# Patient Record
Sex: Male | Born: 1970 | Race: White | Hispanic: No | Marital: Married | Smoking: Never smoker
Health system: Southern US, Community
[De-identification: ages and names within clinical notes are randomized; demographics above are authoritative.]

---

## 2012-12-09 ENCOUNTER — Ambulatory Visit (INDEPENDENT_AMBULATORY_CARE_PROVIDER_SITE_OTHER): Payer: No Typology Code available for payment source | Admitting: Sports Medicine

## 2012-12-09 ENCOUNTER — Encounter: Payer: Self-pay | Admitting: Sports Medicine

## 2012-12-09 ENCOUNTER — Ambulatory Visit
Admission: RE | Admit: 2012-12-09 | Discharge: 2012-12-09 | Disposition: A | Discharge status: Home / Self Care | Payer: No Typology Code available for payment source | Source: Ambulatory Visit | Attending: Emergency Medicine | Admitting: Emergency Medicine

## 2012-12-09 VITALS — BP 123/82 | HR 64 | Ht 73.0 in | Wt 190.0 lb

## 2012-12-09 DIAGNOSIS — M545 Low back pain: Secondary | ICD-10-CM

## 2012-12-09 DIAGNOSIS — M533 Sacrococcygeal disorders, not elsewhere classified: Secondary | ICD-10-CM

## 2012-12-09 MED ORDER — CYCLOBENZAPRINE HCL 10 MG PO TABS: 10.0000 mg | ORAL_TABLET | Freq: Every evening | ORAL | Status: DC | PRN

## 2012-12-09 NOTE — Patient Instructions (Signed)
Take Naprosyn two tablets twice a day of the over the counter dose.  An Xray was ordered for you today.  In addition a prescription for Flexeril is being ordered for you to take at night.  We will evaluate you xrays and discuss further treatment.

## 2012-12-09 NOTE — Assessment & Plan Note (Signed)
Lumbosacral x-rays were ordered today. In addition he'll start a regimen of Naprosyn 500 mg twice a day and Flexeril at night. Will followup after the x-rays.

## 2012-12-09 NOTE — Progress Notes (Signed)
Patient ID: Basilio Meadow, male   DOB: May 29, 1970, 42 y.o.   MRN: [id number] 42 year old male presents with complaint of low back pain. Reports mild back pain the past 2-3 weeks significantly worse and on Sunday morning 5 days ago. Pain is worse when trying to get out of bed in the morning as well as when trying to sit down. Symptoms are improved with standing. Denies weakness or numbness. No radiation of pain down legs. No bowel or bladder incontinence.  He is a fairly active 42 year old male who lifts weights approximately 3 times a week. There's been no change in his weight routine prior to this. He also runs on occasion. He notes that running worsens his symptoms. He also performs karate on occasion this has not bothered his back prior to this week. Denies a significant injury prior to these episodes or any changes in his exercise routine prior to this episode.  Past medical history significant for melanoma which has been treated otherwise negative.  He is a nonsmoker  Family history significant for degenerative disc disease and spinal stenosis  Review of systems as per history of present illness otherwise negative.  Examination: BP 123/82  Pulse 64  Ht 6\' 1"  (1.854 m)  Wt 190 lb (86.183 kg)  BMI 25.07 kg/m2 This is a well-developed well-nourished 42 year old male awake alert and oriented in no acute distress, but obviously uncomfortable standing in exam room.  Examination of his back reveals no evidence of tenderness to palpation in his lumbar region. Pain is reproduced with forward flexion.  Extension to 15 does not worsened symptoms. Negative straight leg raise to 60 bilaterally 5/5 strength in his bilateral lower extremities to flexion extension of the knees as well as plantar or dorsiflexion of the feet. FABER and FADIR testing worsen symptoms in his back most notably on the left. Pulses intact in his bilateral lower extremities.  Gait is normal.

## 2012-12-17 ENCOUNTER — Telehealth: Payer: Self-pay | Admitting: *Deleted

## 2012-12-17 DIAGNOSIS — M545 Low back pain, unspecified: Secondary | ICD-10-CM

## 2012-12-17 NOTE — Telephone Encounter (Signed)
Pt still in significant pain.  Taking naprosyn and flexeril at bedtime.  X-rays reviewed by Dr. Lorri Frederick- he ordered MRI.  Scheduled for 12/24/12 @ 8:30 pm. [facility] [address].

## 2012-12-22 ENCOUNTER — Other Ambulatory Visit: Payer: Self-pay | Admitting: *Deleted

## 2012-12-22 MED ORDER — DIAZEPAM 5 MG PO TABS: ORAL_TABLET | ORAL | Status: DC

## 2012-12-24 ENCOUNTER — Inpatient Hospital Stay: Admission: RE | Admit: 2012-12-24 | Payer: No Typology Code available for payment source | Source: Ambulatory Visit

## 2013-01-01 ENCOUNTER — Other Ambulatory Visit: Payer: No Typology Code available for payment source

## 2015-08-08 IMAGING — CR DG LUMBAR SPINE COMPLETE 4+V
5 series · 5 of 5 positions shown · non-contrast
Comparison: None.

CLINICAL DATA: Low back pain for 5 days, no injury

EXAM:
LUMBAR SPINE - COMPLETE 4+ VIEW

[view not recorded (1 of 5)]
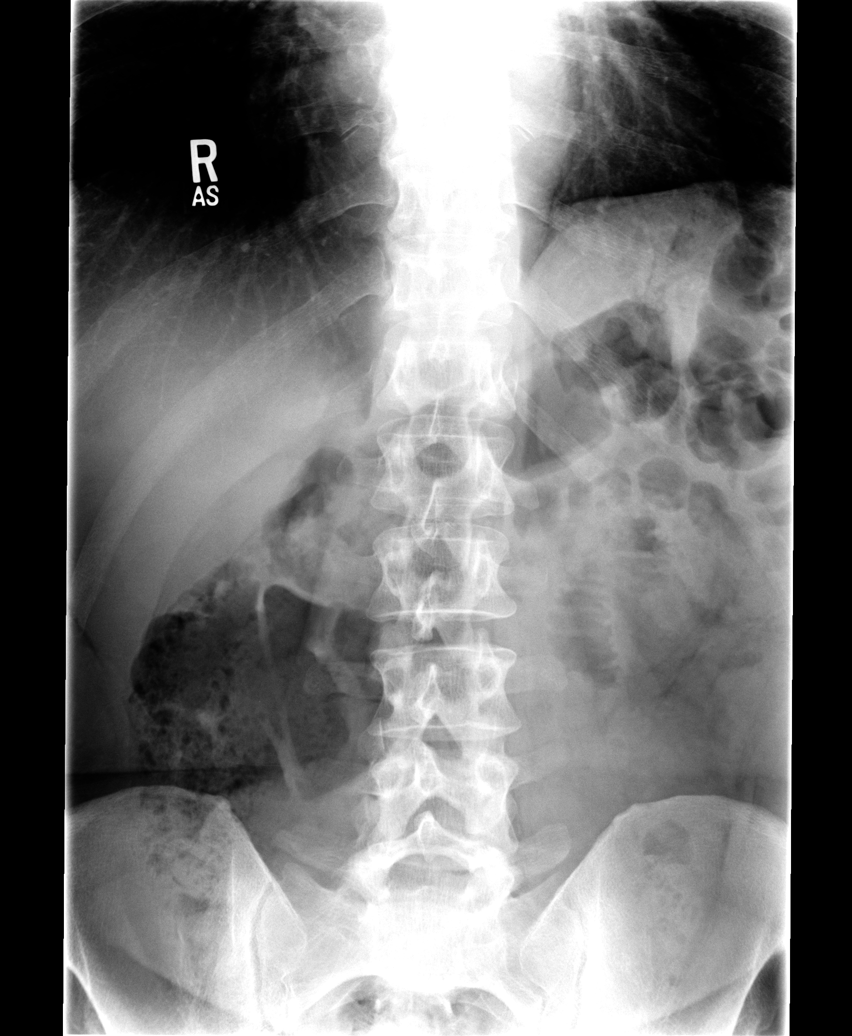

[view not recorded (2 of 5)]
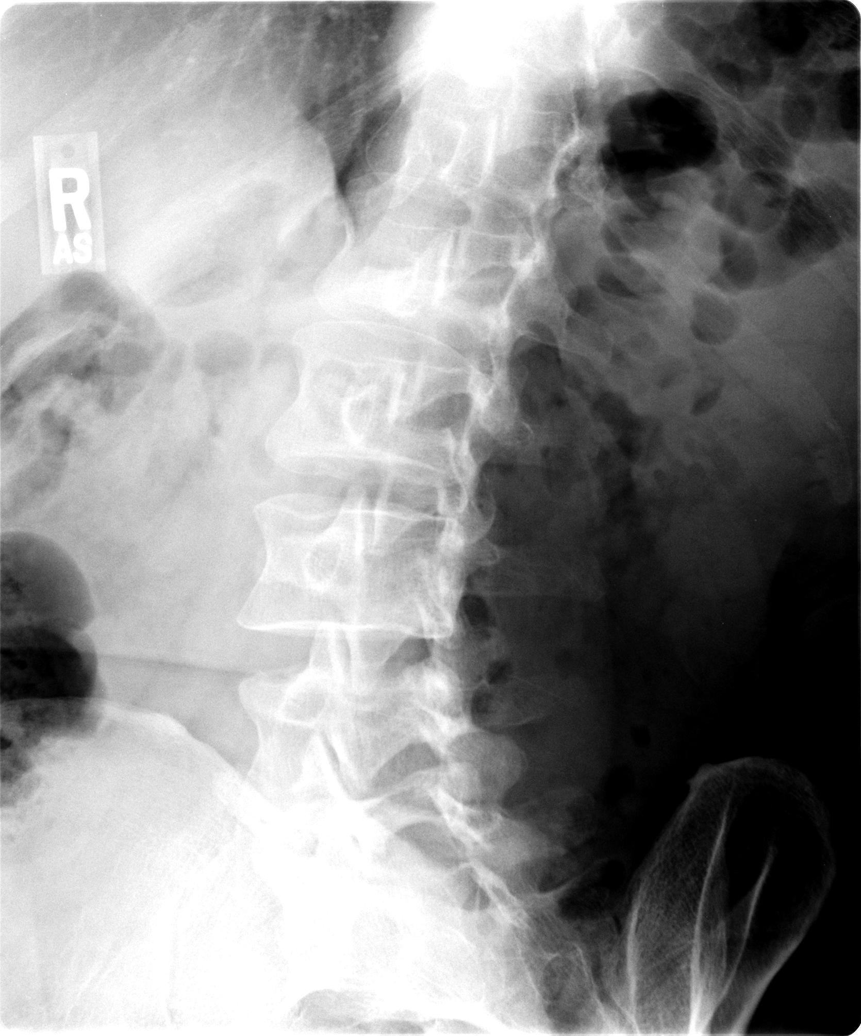

[view not recorded (3 of 5)]
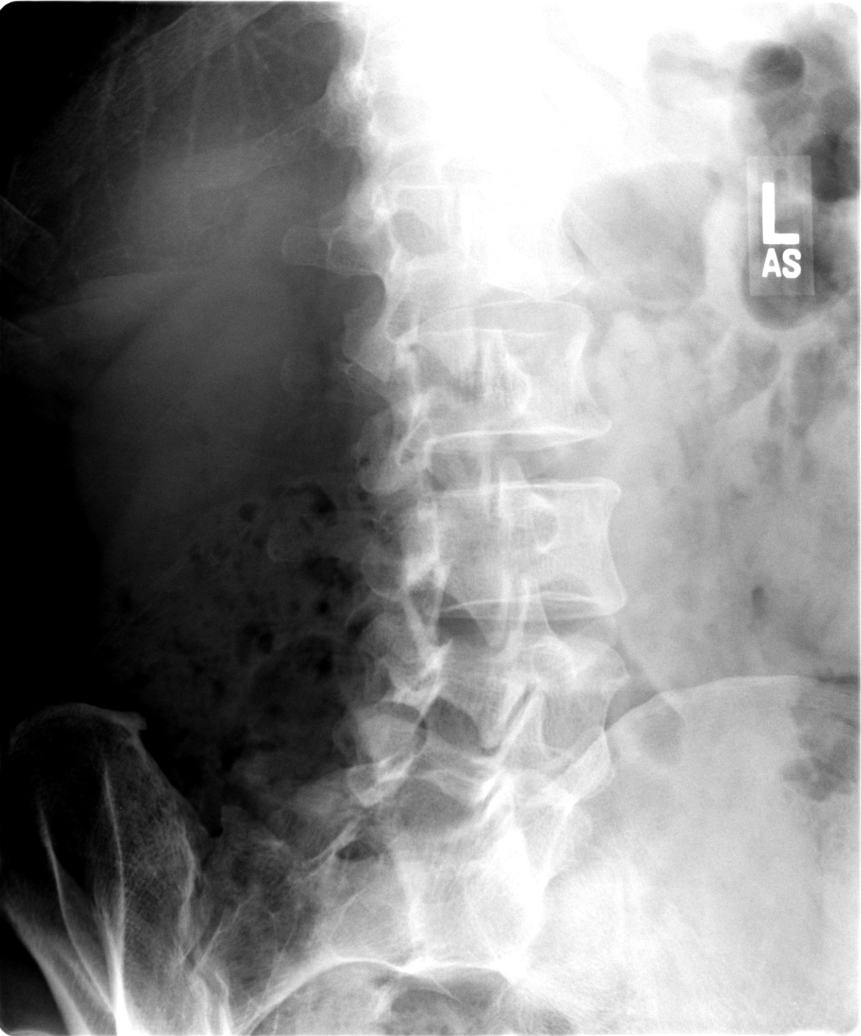

[view not recorded (4 of 5)]
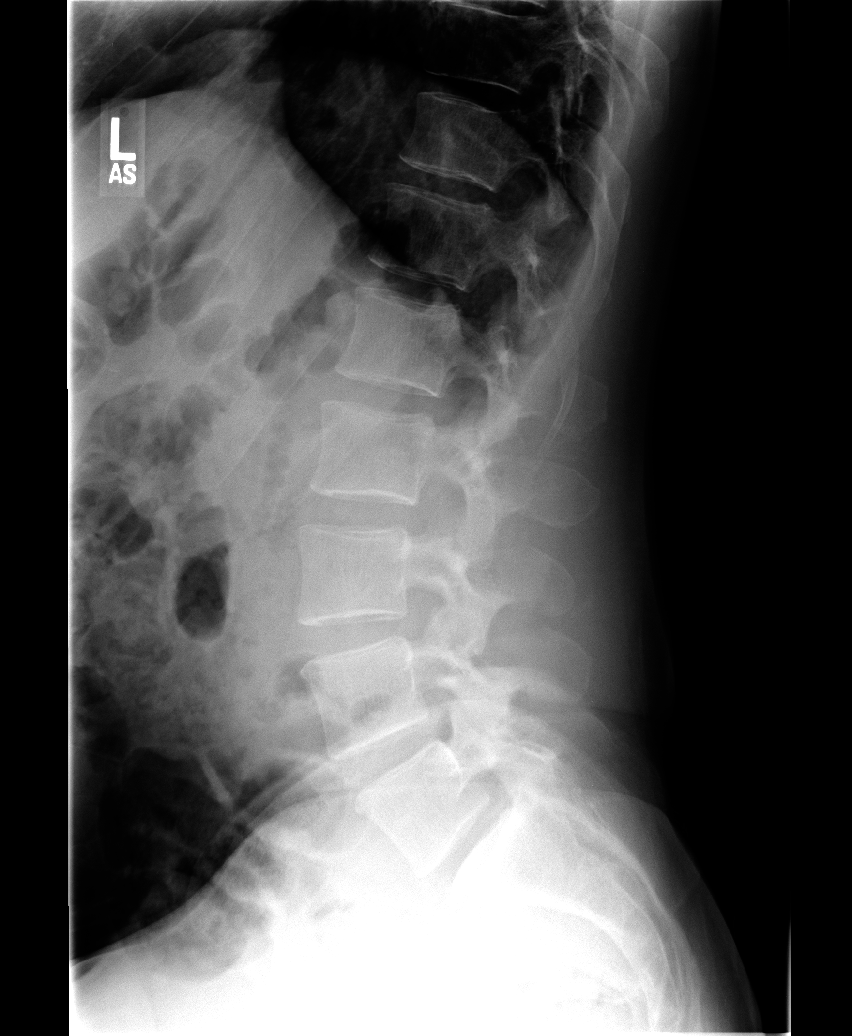

[view not recorded (5 of 5)]
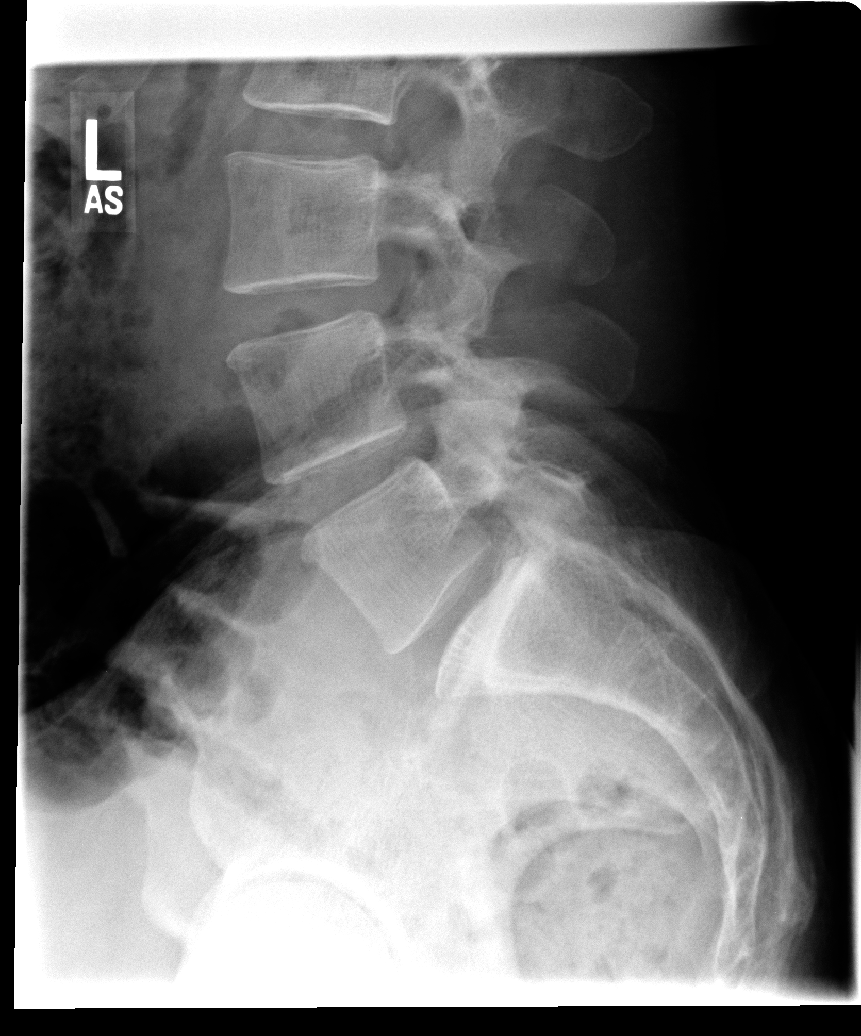

[5 of 5 positions shown; findings below may reference images not displayed]

FINDINGS: There is minimal exaggeration of the lumbar lordosis. Only the L5-S1
disk space is slightly narrowed. The remainder of the intervertebral
disc spaces appear normal. No compression deformity is seen. The SI
joints appear corticated.
IMPRESSION: No acute abnormality. Slightly exaggerated lumbar lordosis. Slight
decrease in disk space at L5-S1.

## 2017-04-21 ENCOUNTER — Ambulatory Visit (INDEPENDENT_AMBULATORY_CARE_PROVIDER_SITE_OTHER): Payer: Commercial Managed Care - PPO | Admitting: Sports Medicine

## 2017-04-21 DIAGNOSIS — S76302A Unspecified injury of muscle, fascia and tendon of the posterior muscle group at thigh level, left thigh, initial encounter: Secondary | ICD-10-CM

## 2017-04-21 DIAGNOSIS — S76312A Strain of muscle, fascia and tendon of the posterior muscle group at thigh level, left thigh, initial encounter: Secondary | ICD-10-CM

## 2017-04-21 DIAGNOSIS — S76319A Strain of muscle, fascia and tendon of the posterior muscle group at thigh level, unspecified thigh, initial encounter: Secondary | ICD-10-CM | POA: Insufficient documentation

## 2017-04-21 NOTE — Assessment & Plan Note (Signed)
Proximal hamstring tear noted on ultrasound, consistent with physical exam findings. From ultrasound findings, would expect at least grade 2 tear. Will begin conservative treatment with ACE wrap for compression. If improvement in pain after ~1wk, patient to begin Askling exercises. F/u in 2 wks. At that time, will likely begin eccentric exercises and transition from ACE wrap to thigh sleeve.

## 2017-04-21 NOTE — Progress Notes (Signed)
   Subjective:    Patient ID: Travis Brennan, male    DOB: 01/24/1971, 47 y.o.   MRN: [id number]  HPI  Travis Brennan is a 47yo M presenting with pain and bruising of his L leg.   Began about 5d ago while patient was at tae kwon do practice. He was performing a roundhouse kick, when he felt a pop in his planted leg. He has since developed significant bruising of the affected leg and pain in his upper thigh. Pain is worse with any weight-bearing, and he is now unable to walk without a limp. Pain is also worse when lifting his leg. He has been applying a heating pad to the affected area and performing stretching exercising, neither of which have improved his symptoms. He has not noticed any weakness in the affected leg. Pain does not keep him awake at night. He went to tae kwon do class last night but only participated as a target.  Of note, this is the third time he has experienced a "pop" in the past few months. Initially felt a pop in his calf, then last month behind his knee, and now in his upper thigh. Did not have bruising or significant pain with two other episodes.   Review of Systems MSK: Denies numbness, tingling in the affected leg.     Objective:   Physical Exam  Constitutional: He is oriented to person, place, and time. He appears well-developed and well-nourished. No distress.  HENT:  Head: Normocephalic and atraumatic.  Musculoskeletal:  LLE: Tender, palpable deformity at musculotendinous junction, however no Popeye deformity. Able to palpate insertion of hamstring at ischial tuberosity Full strength with abduction at both 90 and 30 degrees.   Neurological: He is alert and oriented to person, place, and time.  Skin:  Significant ecchymoses of L leg extending from anterior to popliteal fossa to posterior mid calf.   Psychiatric: He has a normal mood and affect. His behavior is normal.      Assessment & Plan:  Tear of left hamstring Proximal hamstring tear noted on  ultrasound, consistent with physical exam findings. From ultrasound findings, would expect at least grade 2 tear. Will begin conservative treatment with ACE wrap for compression. If improvement in pain after ~1wk, patient to begin Askling exercises. F/u in 2 wks. At that time, will likely begin eccentric exercises and transition from ACE wrap to thigh sleeve.   Tarri AbernethyAbigail J Berle Fitz, MD, MPH PGY-3 [facility] Pager [phone]  Patient seen and evaluated with the resident. I agree with the above plan of care. Patient appears to have a grade 2 tear at the musculotendinous junction of his hamstring. Common hamstring tendon is palpable at the ischial tuberosity and ultrasound confirms that it is intact. Patient has fairly good strength with resistance at 30 and 90 of knee flexion. No obvious deformity of the hamstring tendon other than extensive ecchymosis. Proceed with treatment as above and follow-up with me in 2 weeks for reevaluation. Patient understands that it will be several weeks before he is able to return full time to martial arts.

## 2017-05-05 ENCOUNTER — Ambulatory Visit: Payer: Commercial Managed Care - PPO | Admitting: Sports Medicine

## 2017-05-05 VITALS — BP 124/82 | Ht 72.0 in | Wt 180.0 lb

## 2017-05-05 DIAGNOSIS — S76312A Strain of muscle, fascia and tendon of the posterior muscle group at thigh level, left thigh, initial encounter: Secondary | ICD-10-CM | POA: Diagnosis not present

## 2017-05-05 NOTE — Progress Notes (Signed)
   Subjective:    Patient ID: Travis Brennan, male    DOB: 08/29/1970, 47 y.o.   MRN: [id number]  HPI   Travis Brennan comes in today for follow-up on a grade 2 tear of his left hamstring. He's doing much better. Certainly not 100% but better than he was 2 weeks ago. He is able to ambulate normally now without a limp. He has returned to some limited tae kwon do. He was wearing his Ace wrap up until a few days ago. He has been doing his "Extender" hamstring exercise but was unsure of how to do "The Diver". He is now able to sit more comfortably.     Review of Systems as above    Objective:   Physical Exam  Well-developed, well-nourished. No acute distress. Awake alert and oriented 3. Vital signs reviewed  left leg: Evaluation of the hamstring show some residual ecchymosis distally. There is still some tenderness to palpation at the proximal musculotendinous junction. Common hamstring tendon is intact. No deformity. Patient has excellent strength at both 90 of knee flexion and 30 of knee flexion but does have mild pain with resistance at 30. No soft tissue swelling. Neurovascularly intact distally. Walking without an obvious limp.      Assessment & Plan:   2-1/2 weeks status post grade 2 hamstring tear, left lower leg  Patient is doing well. We will provide him with a compression sleeve to wear with activity. He will continue with his home exercises and I did demonstrate the proper way to do "The Diver". I also explained that it would be several weeks before he is able to return to tae kwon do full time. He understands. Follow-up with me again in 3-4 weeks. Call with questions or concerns in the interim.

## 2017-05-26 ENCOUNTER — Ambulatory Visit: Payer: Commercial Managed Care - PPO | Admitting: Sports Medicine

## 2017-05-26 VITALS — BP 122/70 | Ht 72.0 in | Wt 180.0 lb

## 2017-05-26 DIAGNOSIS — S76319A Strain of muscle, fascia and tendon of the posterior muscle group at thigh level, unspecified thigh, initial encounter: Secondary | ICD-10-CM

## 2017-05-26 NOTE — Assessment & Plan Note (Signed)
-   Improving. Bilateral tears thought to be secondary to dehydration, as no steroid or antibiotic use.  - Recommended continuing compression and Askling exercises until improved. Once feeling 100% better, was counseled to start Nordic exercises and eccentric strengthening exercises. These were demonstrated and handouts provided. - Return as needed.

## 2017-05-26 NOTE — Progress Notes (Addendum)
[facility] Progress Note  Subjective:  Travis Brennan is a 47 y.o. male with history of recent left hamstring tear who presents for follow-up. Just after last visit 05/05/17, he believes he tore his right hamstring with the same motion that caused tear on left--twisting of planted foot with roundhouse kick. He felt a pop at posterior thigh and had bruising like on the left previously. He applied compression with an ace bandage and practiced previously recommended Askling exercises on right and left. He continues to wear compression sleeve on left thigh. He is concerned about why he would have injured himself twice. He denies any antibiotic or steroid use. He used creatine about 15 years ago. He does report sweating a lot with practicing martial arts and that injuries both seemed to have happened at end of workout. He is nervous about restarting martial arts practices. He has not started any eccentric exercises or using weights because of right hamstring injury. He is able to go about his daily activities without issue. ROS: No falls, no rashes  No Known Allergies  Social History   Tobacco Use  . Smoking status: Never Smoker  . Smokeless tobacco: Never Used  Substance Use Topics  . Alcohol use: Not on file    Objective: Blood pressure 122/70, height 6' (1.829 m), weight 180 lb (81.6 kg). Body mass index is 24.41 kg/m. Constitutional: Well-appearing male in NAD Musculoskeletal: No deformities noted of posterior thigh. Insertion point felt over ischial tuberosities. Mildly TTP bilaterally over proximal posterior femur. Full strength with abduction at both 90 and 30 degrees but had spasm at 90 degrees on left and discomfort at 30 degrees on right.  Neurological: AOx3, no focal deficits. Peripheral sensation intact.  Skin: Skin is warm and dry. Small area of ecchymosis over right calf.  Psychiatric: Normal mood and affect.  Vitals reviewed  US on 04/21/17 showed grade 2 tear  at the musculotendinous junction of left hamstring.  Assessment/Plan: Hamstring tear - Improving. Bilateral tears thought to be secondary to dehydration, as no steroid or antibiotic use.  - Recommended continuing compression and Askling exercises until improved. Once feeling 100% better, was counseled to start Nordic exercises and eccentric strengthening exercises. These were demonstrated and handouts provided. - Return as needed.   Travis GobbleHillary Fitzgerald, MD [facility], PGY-3  Patient seen and evaluated with the resident. I agree with the above plan of care. Proceed with treatment as above. Patient understands that it may take several weeks before he is able to return to full-time martial arts. He was given a second compression sleeve to wear on his right thigh. Follow-up as needed.

## 2019-09-12 ENCOUNTER — Ambulatory Visit: Payer: Self-pay | Attending: Internal Medicine

## 2019-09-12 DIAGNOSIS — Z23 Encounter for immunization: Secondary | ICD-10-CM

## 2019-09-12 NOTE — Progress Notes (Signed)
   Covid-19 Vaccination Clinic  Name:  Reubin Bushnell    MRN: [id number] DOB: Jul 02, 1970  09/12/2019  Mr. Taras was observed post Covid-19 immunization for 15 minutes without incident. He was provided with Vaccine Information Sheet and instruction to access the V-Safe system.   Mr. Cragg was instructed to call 911 with any severe reactions post vaccine: Marland Kitchen Difficulty breathing  . Swelling of face and throat  . A fast heartbeat  . A bad rash all over body  . Dizziness and weakness   Immunizations Administered    Name Date Dose VIS Date Route   JANSSEN COVID-19 VACCINE 09/12/2019 11:59 AM 0.5 mL 04/30/2019 Intramuscular   Manufacturer: [employer]   Lot: [id number]   NDC: 41962-229-79

## 2019-09-12 NOTE — Progress Notes (Signed)
cov

## 2023-05-22 DIAGNOSIS — Z0000 Encounter for general adult medical examination without abnormal findings: Secondary | ICD-10-CM | POA: Diagnosis not present

## 2023-05-22 DIAGNOSIS — R6882 Decreased libido: Secondary | ICD-10-CM | POA: Diagnosis not present

## 2023-05-28 DIAGNOSIS — E875 Hyperkalemia: Secondary | ICD-10-CM | POA: Diagnosis not present

## 2023-05-28 DIAGNOSIS — F5221 Male erectile disorder: Secondary | ICD-10-CM | POA: Diagnosis not present

## 2023-05-28 DIAGNOSIS — Z0001 Encounter for general adult medical examination with abnormal findings: Secondary | ICD-10-CM | POA: Diagnosis not present

## 2023-05-28 DIAGNOSIS — N529 Male erectile dysfunction, unspecified: Secondary | ICD-10-CM | POA: Diagnosis not present

## 2023-05-28 DIAGNOSIS — F411 Generalized anxiety disorder: Secondary | ICD-10-CM | POA: Diagnosis not present

## 2023-05-28 DIAGNOSIS — Z0000 Encounter for general adult medical examination without abnormal findings: Secondary | ICD-10-CM | POA: Diagnosis not present

## 2023-07-02 DIAGNOSIS — F411 Generalized anxiety disorder: Secondary | ICD-10-CM | POA: Diagnosis not present

## 2023-11-17 DIAGNOSIS — D225 Melanocytic nevi of trunk: Secondary | ICD-10-CM | POA: Diagnosis not present

## 2023-11-17 DIAGNOSIS — Z85820 Personal history of malignant melanoma of skin: Secondary | ICD-10-CM | POA: Diagnosis not present

## 2023-11-17 DIAGNOSIS — Z1283 Encounter for screening for malignant neoplasm of skin: Secondary | ICD-10-CM | POA: Diagnosis not present

## 2023-11-17 DIAGNOSIS — Z08 Encounter for follow-up examination after completed treatment for malignant neoplasm: Secondary | ICD-10-CM | POA: Diagnosis not present

## 2024-01-27 DIAGNOSIS — J069 Acute upper respiratory infection, unspecified: Secondary | ICD-10-CM | POA: Diagnosis not present
